# Patient Record
Sex: Male | Born: 1960 | Race: White | Hispanic: No | Marital: Married | Smoking: Former smoker
Health system: Southern US, Community
[De-identification: ages and names within clinical notes are randomized; demographics above are authoritative.]

## PROBLEM LIST (undated history)

## (undated) DIAGNOSIS — H409 Unspecified glaucoma: Secondary | ICD-10-CM

## (undated) DIAGNOSIS — IMO0002 Reserved for concepts with insufficient information to code with codable children: Secondary | ICD-10-CM

## (undated) DIAGNOSIS — E785 Hyperlipidemia, unspecified: Secondary | ICD-10-CM

## (undated) DIAGNOSIS — G510 Bell's palsy: Principal | ICD-10-CM

## (undated) DIAGNOSIS — I10 Essential (primary) hypertension: Secondary | ICD-10-CM

## (undated) HISTORY — DX: Bell's palsy: G51.0

## (undated) HISTORY — DX: Essential (primary) hypertension: I10

## (undated) HISTORY — PX: OTHER SURGICAL HISTORY: SHX169

## (undated) HISTORY — DX: Unspecified glaucoma: H40.9

## (undated) HISTORY — DX: Reserved for concepts with insufficient information to code with codable children: IMO0002

## (undated) HISTORY — DX: Hyperlipidemia, unspecified: E78.5

---

## 1987-01-12 HISTORY — PX: VASECTOMY: SHX75

## 1989-01-11 HISTORY — PX: WISDOM TOOTH EXTRACTION: SHX21

## 2005-12-28 ENCOUNTER — Encounter: Admission: RE | Admit: 2005-12-28 | Discharge: 2005-12-28 | Payer: Self-pay | Admitting: Allergy and Immunology

## 2007-07-24 ENCOUNTER — Ambulatory Visit (HOSPITAL_COMMUNITY): Admission: RE | Admit: 2007-07-24 | Discharge: 2007-07-24 | Payer: Self-pay | Admitting: Family Medicine

## 2007-07-26 ENCOUNTER — Ambulatory Visit (HOSPITAL_COMMUNITY): Admission: RE | Admit: 2007-07-26 | Discharge: 2007-07-26 | Payer: Self-pay | Admitting: Family Medicine

## 2009-10-17 ENCOUNTER — Encounter: Payer: Self-pay | Admitting: Orthopedic Surgery

## 2009-10-17 ENCOUNTER — Ambulatory Visit (HOSPITAL_COMMUNITY): Admission: RE | Admit: 2009-10-17 | Discharge: 2009-10-17 | Payer: Self-pay | Admitting: Family Medicine

## 2009-11-18 ENCOUNTER — Encounter: Payer: Self-pay | Admitting: Orthopedic Surgery

## 2009-11-25 ENCOUNTER — Ambulatory Visit: Payer: Self-pay | Admitting: Orthopedic Surgery

## 2009-11-25 DIAGNOSIS — M4712 Other spondylosis with myelopathy, cervical region: Secondary | ICD-10-CM

## 2009-11-25 DIAGNOSIS — M542 Cervicalgia: Secondary | ICD-10-CM | POA: Insufficient documentation

## 2009-11-25 DIAGNOSIS — M47812 Spondylosis without myelopathy or radiculopathy, cervical region: Secondary | ICD-10-CM

## 2009-11-26 ENCOUNTER — Encounter: Payer: Self-pay | Admitting: Orthopedic Surgery

## 2009-12-10 ENCOUNTER — Encounter (HOSPITAL_COMMUNITY)
Admission: RE | Admit: 2009-12-10 | Discharge: 2010-01-09 | Payer: Self-pay | Source: Home / Self Care | Attending: Orthopedic Surgery | Admitting: Orthopedic Surgery

## 2009-12-15 ENCOUNTER — Encounter: Payer: Self-pay | Admitting: Orthopedic Surgery

## 2009-12-16 ENCOUNTER — Ambulatory Visit (HOSPITAL_COMMUNITY)
Admission: RE | Admit: 2009-12-16 | Discharge: 2009-12-16 | Payer: Self-pay | Source: Home / Self Care | Admitting: Orthopedic Surgery

## 2009-12-17 ENCOUNTER — Encounter: Payer: Self-pay | Admitting: Orthopedic Surgery

## 2009-12-18 ENCOUNTER — Encounter (INDEPENDENT_AMBULATORY_CARE_PROVIDER_SITE_OTHER): Payer: Self-pay | Admitting: *Deleted

## 2009-12-18 ENCOUNTER — Encounter: Payer: Self-pay | Admitting: Orthopedic Surgery

## 2009-12-22 ENCOUNTER — Telehealth: Payer: Self-pay | Admitting: Orthopedic Surgery

## 2009-12-29 ENCOUNTER — Telehealth: Payer: Self-pay | Admitting: Orthopedic Surgery

## 2010-01-15 ENCOUNTER — Encounter: Payer: Self-pay | Admitting: Orthopedic Surgery

## 2010-02-02 ENCOUNTER — Encounter: Payer: Self-pay | Admitting: Family Medicine

## 2010-02-10 NOTE — Letter (Signed)
Summary: *Consult Note  [facility]  [address] [place] [address]  [place], [place] [address]   Phone: [phone]  Fax: [phone]    Re:    Christopher Mcintosh DOB:    09/02/1960   Dear Dr Sherwood Gambler and associates:    Thank you for requesting that we see the above patient for consultation.  A copy of the detailed office note will be sent under separate cover, for your review.  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  I also advised him to take diclofenac for pain.  He seems to only have positional radicular symptoms so I do not think he will need surgery unless his MRI shows something different.  At that point we would refer him to neurosurgery.  In the meantime he can be treated with physical therapy and anti-inflammatories using diclofenac 50 mg twice a day, it iis okay to increase that to 75 b.i.d. if he doesn't respond within 4 weeks.  He is returned to you for further followup unless things change.  We will call him with his MRI results.   New Orders include:  1)  Physical Therapy Referral [PT] 2)  New Patient Level III [99203]   New Medications started today include:  1)  DICLOFENAC SODIUM 50 MG TBEC (DICLOFENAC SODIUM) 1 by mouth two times a day   After today's visit, the patients current medications include: 1)  DICLOFENAC SODIUM 50 MG TBEC (DICLOFENAC SODIUM) 1 by mouth two times a day   Thank you for this consultation.  If you have any further questions regarding the care of this patient, please do not hesitate to contact me @ [phone]  Thank you for this opportunity to look after your patient.  Sincerely,   [name] MD

## 2010-02-10 NOTE — Miscellaneous (Signed)
Summary: mri c spine [facility] 12-16-09 reg 1030am DR H TO CALL WITH RESULTS  Clinical Lists Changes      pt aware of appt, Dr. Rexene Edison will call with results, gave pt [facility] radiology number he may have to reschedule MRI appt.

## 2010-02-10 NOTE — Miscellaneous (Signed)
  phone conversation:  patient agreed to consult

## 2010-02-10 NOTE — Miscellaneous (Signed)
  The MRI was reviewed he has multilevel disc disease and encroachment of several nerves one at C6-C1 at C7 and one at C8.  Recommended he call me back to confirm that I can go ahead and make the neurosurgical referral for further treatment and management

## 2010-02-10 NOTE — Assessment & Plan Note (Signed)
Summary: NECK/SHOULDER PAIN NEEDS XR/TRICARE/MEDCOST/BSF   Vital Signs:  Patient profile:   50 year old male Height:      71 inches Weight:      202 pounds Pulse rate:   80 / minute Resp:     18 per minute  Vitals Entered By: [name] MD (November 25, 2009 4:23 PM)  Visit Type:  new patient Referring Provider:  Robbie Lis Primary Provider:  Robbie Lis  CC:  neck pain.  History of Present Illness: I saw Christopher Mcintosh in the office today for an initial visit.  He is a 50 years old man with the complaint of:  neck pain.  No injury.  Xrays of the neck taken [facility] 10/17/09.  No meds  The patient complains of sharp dull stabbing fairly severe pain in his cervical spine which is intermittent.  His pain started gradually and is associated with numbness tingling and a catching sensation as well as stiffness in the cervical spine.  He complains of a locking type feeling when he turns his head.  Occasionally when he sleeping he will get bilateral upper extremity paresthesias.  He denies any headache.  His symptoms have been present for probably 6 months.    Previous treatment includes heat, prednisone Dosepak 12 days which he did get some relief, Flexeril, Ultram.  He says they really don't help him that much.    Allergies (verified): 1)  ! 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 Gastrointestinal:  Complains of heartburn; denies nausea, vomiting, diarrhea, constipation, and blood in your stools. Neurologic:  Complains of numbness and tingling; denies unsteady gait, dizziness, tremors, and seizure. Musculoskeletal:  Complains of  joint pain, stiffness, and muscle pain; denies swelling, instability, redness, and heat. Psychiatric:  Complains of depression; denies nervousness, anxiety, and hallucinations. Skin:  Complains of changes in the skin and itching; denies poor healing, rash, and redness. HEENT:  Complains of blurred or double vision, eye pain, redness, and watering. Immunology:  Complains of seasonal allergies; denies sinus problems and allergic to bee stings.  The review of systems is negative for Cardiovascular, Respiratory, Genitourinary, Endocrine, and Hemoatologic.  Physical Exam  Msk:  abnormal grooming hygiene, normal body habitus, no developmental abnormalities.   Pulses:  normal radial and ulnar pulses 2+ with normal capillary refill and color of both upper extremities Extremities:  RIGHT and LEFT shoulder elbow and wrists have normal range of motion strength stability and alignment  There is tenderness in the cervical spine primarily on each side no tenderness in the midline no tenderness in the interspaces of the vertebrae.  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  He has cervical spine x-rays from October 7 of this year which show loss of disc space and disc height at C5 and 6 and C6 and 7 consistent with degenerative disc disease of the cervical spine.  The patient has had symptoms for  over 6 months and has failed to improve with anti-inflammatories and muscle relaxers including prednisone.  He does have intermittent upper extremity paresthesias and therefore I think an MRI should be done to evaluate the space available for the cord and rule out cord compression.  I did recommend physical therapy and that he continue his medications using his Flexeril when his neck feels tight and adding diclofenac for an anti-inflammatory.  I've advised him that he doesn't seem to need surgery at this point if he does certainly refer him to neurosurgery if he doesn't we will let him continue his physical therapy and continue followup with his primary care physician using diclofenac for pain relief.  Orders: New Patient Level III (30865)  Problem # 2:  NECK PAIN (ICD-723.1) Assessment: New  His updated medication list for this problem includes:    Diclofenac Sodium 50 Mg Tbec (Diclofenac sodium) .Marland Kitchen... 1 by mouth two times a day  Orders: Physical Therapy Referral (PT) New Patient Level III (78469)  Medications Added to Medication List This Visit: 1)  Diclofenac Sodium 50 Mg Tbec (Diclofenac sodium) .Marland Kitchen.. 1 by mouth two times a day  Patient Instructions: 1)  Rec: MRI  2)  Physical Therapy visit  3)  If your MRI shows any need for surgery we will set up a referral for Neurosurgery appointment 4)  Otherwise we will have you follow up with your primary care doctor for medications and refills  5)  I will call you when I get the report on the MRI  Prescriptions: DICLOFENAC SODIUM 50 MG TBEC (DICLOFENAC SODIUM) 1 by mouth two times a day  #60 x 1   Entered and Authorized by:   [name] MD   Signed by:   [name] MD on 11/25/2009   Method used:   Print then Give to Patient   RxID:   [id number]    Orders Added: 1)  Physical Therapy Referral [PT] 2)  New Patient Level III [72536]

## 2010-02-10 NOTE — Miscellaneous (Signed)
Summary: [facility] referral order  Clinical Lists Changes  Orders: Added new Referral order of Neurosurgeon Referral (Neurosurgeon) - Signed 

## 2010-02-10 NOTE — Letter (Signed)
Summary: History form  History form   Imported By: Jacklynn Ganong 11/26/2009 14:11:32  _____________________________________________________________________  External Attachment:    Type:   Image     Comment:   External Document

## 2010-02-12 NOTE — Miscellaneous (Signed)
Summary: PT clinical evaluation  PT clinical evaluation   Imported By: Jacklynn Ganong 01/15/2010 15:55:07  _____________________________________________________________________  External Attachment:    Type:   Image     Comment:   External Document

## 2010-02-12 NOTE — Progress Notes (Signed)
Summary: New referral Faxed,Dr.Roy,[place],accepting [employer] ins  Phone Note Outgoing Call   Call placed to: Specialist Summary of Call: Received fax from [facility] indicating their office not accepting new [employer] patients.   Patient relayed to our office that he fol'd up and that they are accepting.  I called and left msg at Referral dept to return our call regarding status. Initial call taken by: Cammie Sickle,  December 29, 2009 4:24 PM  Follow-up for Phone Call        Follow up call to patient and to [facility].  Lft voice mail msg for patient at home # [phone]. Lft voice mail msg for ref coordinator again at [phone]. Follow-up by: Cammie Sickle,  January 06, 2010 10:33 AM  Additional Follow-up for Phone Call Additional follow up Details #1::        Rec'd call back from [place] at [facility]. She states it is correct that their office is not taking new [employer]-insured patients at this time; therefore, they cannot schedule there.  Patient will need a new referral.  I had left a message for patient today also per above.    Additional Follow-up by: Cammie Sickle,  January 06, 2010 11:30 AM    Additional Follow-up for Phone Call Additional follow up Details #2::    Patient returned call. Explained and advised about [facility] referral not accepting new [employer] referrals.  He will pursue with [employer] network.  I also tried contacting [facility] and their office stated that patient's PCP would need to also refer.  Referral On Hold until patient returns call with additional in-network information.  Follow-up by: Cammie Sickle,  January 06, 2010 5:54 PM  Additional Follow-up for Phone Call Additional follow up Details #3:: Details for Additional Follow-up Action Taken: Patient has checked website and has also called back to [facility], and they again advised him that they're not accepting new [employer] patients. Per website, neurosurgery offices listed include several from [place].  I  have also contacted Dr. Temple Pacini new practice in [facility] [place], [place] [phone] / fax [phone], and waiting on call back re: accepting of Tricare standard there.   *Received confirmation that Dr Temple Pacini office accepts Tricare, as per [facility] accepting it. Referral faxed to this office; patient aware. Additional Follow-up by: Cammie Sickle,  January 28, 2010 12:42 PM

## 2010-02-12 NOTE — Progress Notes (Signed)
Summary: Referral to [facility], neurosurgeon  Phone Note Outgoing Call   Call placed by: Waldon Reining,  December 22, 2009 9:34 AM Call placed to: Specialist Action Taken: Information Sent Summary of Call: I faxed a referral for this patient to [facility] for his C-spine.

## 2010-02-25 ENCOUNTER — Encounter: Payer: Self-pay | Admitting: Orthopedic Surgery

## 2010-03-19 NOTE — Miscellaneous (Signed)
Summary: Phys Therapy Dischg No Show for folup  Phys Therapy Dischg No Show for folup   Imported By: Cammie Sickle 03/10/2010 19:18:50  _____________________________________________________________________  External Attachment:    Type:   Image     Comment:   External Document

## 2012-11-27 ENCOUNTER — Encounter: Payer: Self-pay | Admitting: Internal Medicine

## 2012-12-27 ENCOUNTER — Ambulatory Visit (AMBULATORY_SURGERY_CENTER): Payer: Self-pay | Admitting: *Deleted

## 2012-12-27 VITALS — Ht 71.0 in | Wt 213.4 lb

## 2012-12-27 DIAGNOSIS — Z1211 Encounter for screening for malignant neoplasm of colon: Secondary | ICD-10-CM

## 2012-12-27 MED ORDER — MOVIPREP 100 G PO SOLR: ORAL | Status: DC

## 2012-12-27 NOTE — Progress Notes (Signed)
No allergies to eggs or soy. No problems with anesthesia.  

## 2013-01-10 ENCOUNTER — Ambulatory Visit (AMBULATORY_SURGERY_CENTER): Admitting: Internal Medicine

## 2013-01-10 ENCOUNTER — Encounter: Payer: Self-pay | Admitting: Internal Medicine

## 2013-01-10 VITALS — BP 118/78 | HR 70 | Temp 97.7°F | Resp 29 | Ht 71.0 in | Wt 213.0 lb

## 2013-01-10 DIAGNOSIS — D126 Benign neoplasm of colon, unspecified: Secondary | ICD-10-CM

## 2013-01-10 DIAGNOSIS — Z1211 Encounter for screening for malignant neoplasm of colon: Secondary | ICD-10-CM

## 2013-01-10 MED ORDER — SODIUM CHLORIDE 0.9 % IV SOLN: 500.0000 mL | INTRAVENOUS | Status: DC

## 2013-01-10 NOTE — Op Note (Signed)
[facility] [address] [place], [address]   COLONOSCOPY PROCEDURE REPORT  PATIENT: Christopher, Mcintosh  MR#: [id number] BIRTHDATE: 1960-05-20 , 52  yrs. old GENDER: Male ENDOSCOPIST: Roxy Cedar, MD REFERRED XB:JYNWG Link Snuffer, M.D. PROCEDURE DATE:  01/10/2013 PROCEDURE:   Colonoscopy with snare polypectomy x 1 First Screening Colonoscopy - Avg.  risk and is 50 yrs.  old or older Yes.  Prior Negative Screening - Now for repeat screening. N/A  History of Adenoma - Now for follow-up colonoscopy & has been > or = to 3 yrs.  N/A  Polyps Removed Today? Yes. ASA CLASS:   Class II INDICATIONS:average risk screening. MEDICATIONS: MAC sedation, administered by CRNA and propofol (Diprivan) 300mg  IV  DESCRIPTION OF PROCEDURE:   After the risks benefits and alternatives of the procedure were thoroughly explained, informed consent was obtained.  A digital rectal exam revealed no abnormalities of the rectum.   The LB [id number]  endoscope was introduced through the anus and advanced to the cecum, which was identified by both the appendix and ileocecal valve. No adverse events experienced.   The quality of the prep was good, using MoviPrep  The instrument was then slowly withdrawn as the colon was fully examined.   COLON FINDINGS: A diminutive polyp was found at the cecum.  A polypectomy was performed with a cold snare.  The resection was complete and the polyp tissue was completely retrieved.   Mild diverticulosis was noted in the sigmoid colon.   The colon mucosa was otherwise normal.  Retroflexed views revealed internal hemorrhoids. The time to cecum=3 minutes 46 seconds.  Withdrawal time=11 minutes 37 seconds.  The scope was withdrawn and the procedure completed. COMPLICATIONS: There were no complications.  ENDOSCOPIC IMPRESSION: 1.   Diminutive polyp was found at the cecum; polypectomy was performed with a cold snare 2.   Mild diverticulosis was noted  in the sigmoid colon 3.   The colon mucosa was otherwise normal  RECOMMENDATIONS: 1. Repeat colonoscopy in 5 years if polyp adenomatous; otherwise 10 years   eSigned:  Roxy Cedar, MD 01/10/2013 9:37 AM   cc: The Patient    ; Alysia Penna, MD

## 2013-01-10 NOTE — Progress Notes (Signed)
Called to room to assist during endoscopic procedure.  Patient ID and intended procedure confirmed with present staff. Received instructions for my participation in the procedure from the performing physician.  

## 2013-01-10 NOTE — Patient Instructions (Signed)
YOU HAD AN ENDOSCOPIC PROCEDURE TODAY AT THE [facility]: Refer to the procedure report that was given to you for any specific questions about what was found during the examination.  If the procedure report does not answer your questions, please call your gastroenterologist to clarify.  If you requested that your care partner not be given the details of your procedure findings, then the procedure report has been included in a sealed envelope for you to review at your convenience later.  YOU SHOULD EXPECT: Some feelings of bloating in the abdomen. Passage of more gas than usual.  Walking can help get rid of the air that was put into your GI tract during the procedure and reduce the bloating. If you had a lower endoscopy (such as a colonoscopy or flexible sigmoidoscopy) you may notice spotting of blood in your stool or on the toilet paper. If you underwent a bowel prep for your procedure, then you may not have a normal bowel movement for a few days.  DIET: Your first meal following the procedure should be a light meal and then it is ok to progress to your normal diet.  A half-sandwich or bowl of soup is an example of a good first meal.  Heavy or fried foods are harder to digest and may make you feel nauseous or bloated.  Likewise meals heavy in dairy and vegetables can cause extra gas to form and this can also increase the bloating.  Drink plenty of fluids but you should avoid alcoholic beverages for 24 hours.  ACTIVITY: Your care partner should take you home directly after the procedure.  You should plan to take it easy, moving slowly for the rest of the day.  You can resume normal activity the day after the procedure however you should NOT DRIVE or use heavy machinery for 24 hours (because of the sedation medicines used during the test).    SYMPTOMS TO REPORT IMMEDIATELY: A gastroenterologist can be reached at any hour.  During normal business hours, 8:30 AM to 5:00 PM Monday through Friday,  call [phone].  After hours and on weekends, please call the GI answering service at [phone]  Emergency number who will take a message and have the physician on call contact you.   Following lower endoscopy (colonoscopy or flexible sigmoidoscopy):  Excessive amounts of blood in the stool  Significant tenderness or worsening of abdominal pains  Swelling of the abdomen that is new, acute  Fever of 100F or higher  FOLLOW UP: If any biopsies were taken you will be contacted by phone or by letter within the next 1-3 weeks.  Call your gastroenterologist if you have not heard about the biopsies in 3 weeks.  Our staff will call the home number listed on your records the next business day following your procedure to check on you and address any questions or concerns that you may have at that time regarding the information given to you following your procedure. This is a courtesy call and so if there is no answer at the home number and we have not heard from you through the emergency physician on call, we will assume that you have returned to your regular daily activities without incident.  SIGNATURES/CONFIDENTIALITY: You and/or your care partner have signed paperwork which will be entered into your electronic medical record.  These signatures attest to the fact that that the information above on your After Visit Summary has been reviewed and is understood.  Full responsibility of the   confidentiality of this discharge information lies with you and/or your care-partner.  Handout on polyps diverticulosis high fiber diet

## 2013-01-10 NOTE — Progress Notes (Signed)
Patient did not experience any of the following events: a burn prior to discharge; a fall within the facility; wrong site/side/patient/procedure/implant event; or a hospital transfer or hospital admission upon discharge from the facility. (G8907) Patient did not have preoperative order for IV antibiotic SSI prophylaxis. (G8918)  

## 2013-01-12 ENCOUNTER — Telehealth: Payer: Self-pay

## 2013-01-12 NOTE — Telephone Encounter (Signed)
  Follow up Call-  Call back number 01/10/2013  Post procedure Call Back phone  # [phone]  Permission to leave phone message Yes     Patient questions:  Do you have a fever, pain , or abdominal swelling? no Pain Score  0 *  Have you tolerated food without any problems? yes  Have you been able to return to your normal activities? yes  Do you have any questions about your discharge instructions: Diet   no Medications  no Follow up visit  no  Do you have questions or concerns about your Care? no  Actions: * If pain score is 4 or above: No action needed, pain <4.

## 2013-01-15 ENCOUNTER — Encounter: Payer: Self-pay | Admitting: Internal Medicine

## 2013-07-21 ENCOUNTER — Emergency Department (HOSPITAL_COMMUNITY)
Admission: EM | Admit: 2013-07-21 | Discharge: 2013-07-21 | Disposition: A | Discharge status: Home / Self Care | Payer: Self-pay | Attending: Emergency Medicine | Admitting: Emergency Medicine

## 2013-07-21 ENCOUNTER — Emergency Department (HOSPITAL_COMMUNITY): Payer: Self-pay

## 2013-07-21 ENCOUNTER — Emergency Department (HOSPITAL_COMMUNITY)
Admission: EM | Admit: 2013-07-21 | Discharge: 2013-07-21 | Discharge status: Absent without leave | Source: Home / Self Care | Attending: Emergency Medicine | Admitting: Emergency Medicine

## 2013-07-21 ENCOUNTER — Encounter (HOSPITAL_COMMUNITY): Payer: Self-pay | Admitting: Emergency Medicine

## 2013-07-21 ENCOUNTER — Encounter

## 2013-07-21 ENCOUNTER — Other Ambulatory Visit (HOSPITAL_COMMUNITY)

## 2013-07-21 DIAGNOSIS — G510 Bell's palsy: Secondary | ICD-10-CM | POA: Insufficient documentation

## 2013-07-21 LAB — DIFFERENTIAL
BASOS ABS: 0 10*3/uL (ref 0.0–0.1)
BASOS PCT: 0 % (ref 0–1)
EOS PCT: 1 % (ref 0–5)
Eosinophils Absolute: 0.1 10*3/uL (ref 0.0–0.7)
LYMPHS PCT: 43 % (ref 12–46)
Lymphs Abs: 2.3 10*3/uL (ref 0.7–4.0)
Monocytes Absolute: 0.4 10*3/uL (ref 0.1–1.0)
Monocytes Relative: 7 % (ref 3–12)
NEUTROS ABS: 2.6 10*3/uL (ref 1.7–7.7)
Neutrophils Relative %: 49 % (ref 43–77)

## 2013-07-21 LAB — CBC
HCT: 44.6 % (ref 39.0–52.0)
Hemoglobin: 15.4 g/dL (ref 13.0–17.0)
MCH: 30.9 pg (ref 26.0–34.0)
MCHC: 34.5 g/dL (ref 30.0–36.0)
MCV: 89.6 fL (ref 78.0–100.0)
PLATELETS: 188 10*3/uL (ref 150–400)
RBC: 4.98 MIL/uL (ref 4.22–5.81)
RDW: 13.2 % (ref 11.5–15.5)
WBC: 5.3 10*3/uL (ref 4.0–10.5)

## 2013-07-21 LAB — COMPREHENSIVE METABOLIC PANEL
ALT: 35 U/L (ref 0–53)
AST: 23 U/L (ref 0–37)
Albumin: 4.2 g/dL (ref 3.5–5.2)
Alkaline Phosphatase: 96 U/L (ref 39–117)
Anion gap: 13 (ref 5–15)
BUN: 9 mg/dL (ref 6–23)
CO2: 27 meq/L (ref 19–32)
CREATININE: 1.05 mg/dL (ref 0.50–1.35)
Calcium: 9.8 mg/dL (ref 8.4–10.5)
Chloride: 102 mEq/L (ref 96–112)
GFR calc Af Amer: >90 mL/min (ref >90)
GFR, EST NON AFRICAN AMERICAN: 80 mL/min — AB (ref >90)
Glucose, Bld: 124 mg/dL — ABNORMAL HIGH (ref 70–99)
Potassium: 4.4 mEq/L (ref 3.7–5.3)
SODIUM: 142 meq/L (ref 137–147)
Total Bilirubin: 0.4 mg/dL (ref 0.3–1.2)
Total Protein: 7.2 g/dL (ref 6.0–8.3)

## 2013-07-21 MED ORDER — ACYCLOVIR 400 MG PO TABS: 800.0000 mg | ORAL_TABLET | Freq: Two times a day (BID) | ORAL | Status: DC

## 2013-07-21 MED ORDER — PREDNISONE 10 MG PO TABS: 60.0000 mg | ORAL_TABLET | Freq: Every day | ORAL | Status: DC

## 2013-07-21 NOTE — Discharge Instructions (Signed)
Bell's Palsy °Bell's palsy is a condition in which the muscles on one side of the face cannot move (paralysis). This is because the nerves in the face are paralyzed. It is most often thought to be caused by a virus. The virus causes swelling of the nerve that controls movement on one side of the face. The nerve travels through a tight space surrounded by bone. When the nerve swells, it can be compressed by the bone. This results in damage to the protective covering around the nerve. This damage interferes with how the nerve communicates with the muscles of the face. As a result, it can cause weakness or paralysis of the facial muscles.  °Injury (trauma), tumor, and surgery may cause Bell's palsy, but most of the time the cause is unknown. It is a relatively common condition. It starts suddenly (abrupt onset) with the paralysis usually ending within 2 days. Bell's palsy is not dangerous. But because the eye does not close properly, you may need care to keep the eye from getting dry. This can include splinting (to keep the eye shut) or moistening with artificial tears. Bell's palsy very seldom occurs on both sides of the face at the same time. °SYMPTOMS  °· Eyebrow sagging. °· Drooping of the eyelid and corner of the mouth. °· Inability to close one eye. °· Loss of taste on the front of the tongue. °· Sensitivity to loud noises. °TREATMENT  °The treatment is usually non-surgical. If the patient is seen within the first 24 to 48 hours, a short course of steroids may be prescribed, in an attempt to shorten the length of the condition. Antiviral medicines may also be used with the steroids, but it is unclear if they are helpful.  °You will need to protect your eye, if you cannot close it. The cornea (clear covering over your eye) will become dry and can be damaged. Artificial tears can be used to keep your eye moist. Glasses or an eye patch should be worn to protect your eye. °PROGNOSIS  °Recovery is variable, ranging  from days to months. Although the problem usually goes away completely (about 80% of cases resolve), predicting the outcome is impossible. Most people improve within 3 weeks of when the symptoms began. Improvement may continue for 3 to 6 months. A small number of people have moderate to severe weakness that is permanent.  °HOME CARE INSTRUCTIONS  °· If your caregiver prescribed medication to reduce swelling in the nerve, use as directed. Do not stop taking the medication unless directed by your caregiver. °· Use moisturizing eye drops as needed to prevent drying of your eye, as directed by your caregiver. °· Protect your eye, as directed by your caregiver. °· Use facial massage and exercises, as directed by your caregiver. °· Perform your normal activities, and get your normal rest. °SEEK IMMEDIATE MEDICAL CARE IF:  °· There is pain, redness or irritation in the eye. °· You or your child has an oral temperature above 102° F (38.9° C), not controlled by medicine. °MAKE SURE YOU:  °· Understand these instructions. °· Will watch your condition. °· Will get help right away if you are not doing well or get worse. °Document Released: 12/28/2004 Document Revised: 03/22/2011 Document Reviewed: 01/06/2009 °ExitCare® Patient Information ©2015 [employer]. This information is not intended to replace advice given to you by your health care provider. Make sure you discuss any questions you have with your health care provider. ° °

## 2013-07-21 NOTE — ED Notes (Signed)
Pt sent here from [facility]. Reports onset of headache since tues or wed. Having right eye droop, unable to close his eye and drainage from it. Reports numbness to right mouth since yesterday. Sent here for possible bells palsy, r/o stroke. No acute distress noted at triage, bp 182/107.

## 2013-07-21 NOTE — ED Notes (Signed)
Pt sent here from [facility] for headache that started approx tues or wed. Having right eye droop and unable to close right eye, having drainage from it.  Reports numbness to right side of mouth since yesterday. Sent here to r/o bells palsy vs cva.

## 2013-07-21 NOTE — ED Provider Notes (Addendum)
CSN: [id number]     Arrival date & time 07/21/13  1520 History   First MD Initiated Contact with Patient 07/21/13 1528     Chief Complaint  Patient presents with  . Headache  . Facial Droop     (Consider location/radiation/quality/duration/timing/severity/associated sxs/prior Treatment) Patient is a 53 y.o. male presenting with headaches. The history is provided by the patient.  Headache Pain location:  R parietal Quality:  Dull Radiates to:  Does not radiate Onset quality:  Gradual Duration:  3 days Timing:  Intermittent Progression:  Worsening Chronicity:  New Similar to prior headaches: no   Context: not exposure to bright light, not caffeine, not eating and not loud noise   Relieved by:  Nothing Worsened by:  Nothing tried Associated symptoms: no abdominal pain, no cough, no fever and no vomiting     No past medical history on file. No past surgical history on file. No family history on file. History  Substance Use Topics  . Smoking status: Not on file  . Smokeless tobacco: Not on file  . Alcohol Use: Not on file    Review of Systems  Constitutional: Negative for fever.  Respiratory: Negative for cough and shortness of breath.   Cardiovascular: Negative for leg swelling.  Gastrointestinal: Negative for vomiting and abdominal pain.  Neurological: Positive for headaches.  All other systems reviewed and are negative.     Allergies  Review of patient's allergies indicates not on file.  Home Medications   Prior to Admission medications   Medication Sig Start Date End Date Taking? Authorizing Provider  acyclovir (ZOVIRAX) 400 MG tablet Take 2 tablets (800 mg total) by mouth 2 (two) times daily. 07/21/13   Dagmar Hait, MD  predniSONE (DELTASONE) 10 MG tablet Take 6 tablets (60 mg total) by mouth daily. Please take 6 tablets once daily for 5 days, then 5 tablets one day, then 4 tablets one day, then 3 tablets one day, then 2 tablets one day, then one  tablet on the 10th day 07/21/13   Dagmar Hait, MD   BP 162/84  Pulse 59  Temp(Src) 97.3 F (36.3 C) (Oral)  Resp 12  SpO2 97% Physical Exam  Nursing note and vitals reviewed. Constitutional: He is oriented to person, place, and time. He appears well-developed and well-nourished. No distress.  HENT:  Head: Normocephalic and atraumatic.  Mouth/Throat: Oropharynx is clear and moist. No oropharyngeal exudate.  Eyes: EOM are normal. Pupils are equal, round, and reactive to light.  Neck: Normal range of motion. Neck supple.  Cardiovascular: Normal rate and regular rhythm.  Exam reveals no friction rub.   No murmur heard. Pulmonary/Chest: Effort normal and breath sounds normal. No respiratory distress. He has no wheezes. He has no rales.  Abdominal: He exhibits no distension. There is no tenderness. There is no rebound.  Musculoskeletal: Normal range of motion. He exhibits no edema.  Neurological: He is alert and oriented to person, place, and time. A cranial nerve deficit (R sided facial droop with forehead involvement) is present. No sensory deficit. GCS eye subscore is 4. GCS verbal subscore is 5. GCS motor subscore is 6.  Skin: No rash noted. He is not diaphoretic.    ED Course  Procedures (including critical care time) Labs Review Labs Reviewed - No data to display  Imaging Review No results found.   EKG Interpretation None      MDM   Final diagnoses:  Bell's palsy    53 year old male here with  facial droop. Began last night. Can constant. [facility] concerns of Bell's palsy and headache. The doctor there is requesting a CT or MRI. Upon arrival, patient has Bell's palsy. Forehead was not spared and right side of face is not working. I spoke with the neurologist who reported CT would be appropriate for imaging. No facial numbness. CT is negative. Given steroids, acyclovir, instructed to tape his eye shut while sleeping. Given neurology  followup. Head CT report under patient named Lorie Apleyobert L Lucken with same birthdate.  Patient also has HTN. Instructed to f/u with PCP for HTN management.   Dagmar HaitWilliam Elis Sauber, MD 07/21/13 1545  Dagmar HaitWilliam Bethany Cumming, MD 07/21/13 [phone]

## 2013-07-25 LAB — I-STAT TROPONIN, ED
TROPONIN I, POC: 0 ng/mL (ref 0.00–0.08)
TROPONIN I, POC: 0 ng/mL (ref 0.00–0.08)

## 2013-08-10 ENCOUNTER — Ambulatory Visit (INDEPENDENT_AMBULATORY_CARE_PROVIDER_SITE_OTHER): Payer: Self-pay | Admitting: Neurology

## 2013-08-10 ENCOUNTER — Encounter: Payer: Self-pay | Admitting: Neurology

## 2013-08-10 VITALS — BP 157/87 | HR 67 | Ht 70.5 in | Wt 230.0 lb

## 2013-08-10 DIAGNOSIS — G510 Bell's palsy: Secondary | ICD-10-CM | POA: Insufficient documentation

## 2013-08-10 HISTORY — DX: Bell's palsy: G51.0

## 2013-08-10 NOTE — Progress Notes (Signed)
Reason for visit: Bell's palsy  Taylor Roberts is a 53 y.o. male  History of present illness:  Mr. Taylor Roberts is a 53 year old white male with a history of hypertension, untreated. The patient went to the emergency room on 07/21/2013 with onset the day prior of some right facial weakness. He began having some discomfort around the right ear, and in the frontal and temporal regions, radiating back into the occipital area head. The patient has noted some tearing around the right eye, and some hyperacusis involving the right ear. He has not noted any alteration in taste. He has been using artificial tears to help keep the eye wet. He has not noted any speech or swallowing problems, and he has not had any weakness or numbness of the extremities. He denies any coordination problems with the arms or legs, or difficulty with balance with walking, and he denies any problems controlling the bowels or the bladder. He comes to this office for an evaluation. A CT scan of the brain was done through the emergency room, and this was unremarkable. His ability to close his right eye has already improved. He still has some residual mild headache on the right side.  Past Medical History  Diagnosis Date  . Hypertension   . Right-sided Bell's palsy 08/10/2013    Past Surgical History  Procedure Laterality Date  . None      Family History  Problem Relation Age of Onset  . Hypertension Mother   . Cancer - Prostate Father   . Hypertension Father   . Hypertension Sister   . Hypertension Brother     Social history:  reports that he has been smoking Cigarettes.  He has a 35 pack-year smoking history. He has never used smokeless tobacco. He reports that he drinks alcohol. He reports that he does not use illicit drugs.  Medications:  No current outpatient prescriptions on file prior to visit.   No current facility-administered medications on file prior to visit.     No Known Allergies  ROS:  Out of a  complete 14 system review of symptoms, the patient complains only of the following symptoms, and all other reviewed systems are negative.  Eye pain Headache, slurred speech  Blood pressure 157/87, pulse 67, height 5' 10.5" (1.791 m), weight 230 lb (104.327 kg).  Physical Exam  General: The patient is alert and cooperative at the time of the examination.  Eyes: Pupils are equal, round, and reactive to light. Discs are flat bilaterally.  Ears: Tympanic membranes are clear bilaterally.  Neck: The neck is supple, no carotid bruits are noted.  Respiratory: The respiratory examination is clear.  Cardiovascular: The cardiovascular examination reveals a regular rate and rhythm, no obvious murmurs or rubs are noted.  Skin: Extremities are without significant edema.  Neurologic Exam  Mental status: The patient is alert and oriented x 3 at the time of the examination. The patient has apparent normal recent and remote memory, with an apparently normal attention span and concentration ability.  Cranial nerves: Facial symmetry is not present. There is good sensation of the face to pinprick and soft touch bilaterally. The strength of the facial muscles and the muscles to head turning and shoulder shrug are normal bilaterally, with the exception that there is some moderate peripheral facial weakness on the right. Speech is well enunciated, no aphasia or dysarthria is noted. Extraocular movements are full. Visual fields are full. The tongue is midline, and the patient has symmetric elevation of  the soft palate. No obvious hearing deficits are noted.  Motor: The motor testing reveals 5 over 5 strength of all 4 extremities. Good symmetric motor tone is noted throughout.  Sensory: Sensory testing is intact to pinprick, soft touch, vibration sensation, and position sense on all 4 extremities. No evidence of extinction is noted.  Coordination: Cerebellar testing reveals good finger-nose-finger and  heel-to-shin bilaterally.  Gait and station: Gait is normal. Tandem gait is normal. Romberg is negative. No drift is seen.  Reflexes: Deep tendon reflexes are symmetric and normal bilaterally. Toes are downgoing bilaterally.   CT head 07/21/13:  IMPRESSION:  Negative head CT.   Assessment/Plan:  One. Right Bell's palsy  The patient is already getting benefit with the weakness, he should do quite well over time. The patient will followup through this office if needed. He will take Motrin on a scheduled basis taking 600 mg 3 times daily over the next week to help benefit the headache. He is to contact our office if he has any concerns.  Marlan Palau MD 08/11/2013 10:17 AM  [facility] [address] [place], [place] [address]  Phone [phone] Fax [phone]

## 2013-08-10 NOTE — Patient Instructions (Signed)
Bell's Palsy °Bell's palsy is a condition in which the muscles on one side of the face cannot move (paralysis). This is because the nerves in the face are paralyzed. It is most often thought to be caused by a virus. The virus causes swelling of the nerve that controls movement on one side of the face. The nerve travels through a tight space surrounded by bone. When the nerve swells, it can be compressed by the bone. This results in damage to the protective covering around the nerve. This damage interferes with how the nerve communicates with the muscles of the face. As a result, it can cause weakness or paralysis of the facial muscles.  °Injury (trauma), tumor, and surgery may cause Bell's palsy, but most of the time the cause is unknown. It is a relatively common condition. It starts suddenly (abrupt onset) with the paralysis usually ending within 2 days. Bell's palsy is not dangerous. But because the eye does not close properly, you may need care to keep the eye from getting dry. This can include splinting (to keep the eye shut) or moistening with artificial tears. Bell's palsy very seldom occurs on both sides of the face at the same time. °SYMPTOMS  °· Eyebrow sagging. °· Drooping of the eyelid and corner of the mouth. °· Inability to close one eye. °· Loss of taste on the front of the tongue. °· Sensitivity to loud noises. °TREATMENT  °The treatment is usually non-surgical. If the patient is seen within the first 24 to 48 hours, a short course of steroids may be prescribed, in an attempt to shorten the length of the condition. Antiviral medicines may also be used with the steroids, but it is unclear if they are helpful.  °You will need to protect your eye, if you cannot close it. The cornea (clear covering over your eye) will become dry and can be damaged. Artificial tears can be used to keep your eye moist. Glasses or an eye patch should be worn to protect your eye. °PROGNOSIS  °Recovery is variable, ranging  from days to months. Although the problem usually goes away completely (about 80% of cases resolve), predicting the outcome is impossible. Most people improve within 3 weeks of when the symptoms began. Improvement may continue for 3 to 6 months. A small number of people have moderate to severe weakness that is permanent.  °HOME CARE INSTRUCTIONS  °· If your caregiver prescribed medication to reduce swelling in the nerve, use as directed. Do not stop taking the medication unless directed by your caregiver. °· Use moisturizing eye drops as needed to prevent drying of your eye, as directed by your caregiver. °· Protect your eye, as directed by your caregiver. °· Use facial massage and exercises, as directed by your caregiver. °· Perform your normal activities, and get your normal rest. °SEEK IMMEDIATE MEDICAL CARE IF:  °· There is pain, redness or irritation in the eye. °· You or your child has an oral temperature above 102° F (38.9° C), not controlled by medicine. °MAKE SURE YOU:  °· Understand these instructions. °· Will watch your condition. °· Will get help right away if you are not doing well or get worse. °Document Released: 12/28/2004 Document Revised: 03/22/2011 Document Reviewed: 04/06/2013 °[employer]® Patient Information ©2015 [employer]. This information is not intended to replace advice given to you by your health care provider. Make sure you discuss any questions you have with your health care provider. ° °

## 2015-03-31 ENCOUNTER — Other Ambulatory Visit (HOSPITAL_BASED_OUTPATIENT_CLINIC_OR_DEPARTMENT_OTHER): Payer: Self-pay | Admitting: Family Medicine

## 2015-03-31 DIAGNOSIS — R0989 Other specified symptoms and signs involving the circulatory and respiratory systems: Secondary | ICD-10-CM

## 2015-04-04 ENCOUNTER — Ambulatory Visit (HOSPITAL_BASED_OUTPATIENT_CLINIC_OR_DEPARTMENT_OTHER)
Admission: RE | Admit: 2015-04-04 | Discharge: 2015-04-04 | Disposition: A | Discharge status: Home / Self Care | Payer: BLUE CROSS/BLUE SHIELD | Source: Ambulatory Visit | Attending: Family Medicine | Admitting: Family Medicine

## 2015-04-04 DIAGNOSIS — I6523 Occlusion and stenosis of bilateral carotid arteries: Secondary | ICD-10-CM | POA: Diagnosis not present

## 2015-04-04 DIAGNOSIS — R0989 Other specified symptoms and signs involving the circulatory and respiratory systems: Secondary | ICD-10-CM | POA: Insufficient documentation

## 2015-04-26 IMAGING — CT CT HEAD W/O CM
2 series · 16 of 30 positions shown, 20 images · non-contrast
Comparison: None.

CLINICAL DATA: Headache. Right eye drooping. Numbness right side of
the mouth.

EXAM:
CT HEAD WITHOUT CONTRAST
TECHNIQUE: Contiguous axial images were obtained from the base of the skull
through the vertex without intravenous contrast.

[Series 201: head w/o, idose (1) · axial · non-contrast · 0.49mm/px · z∈[+100,+230]mm · 13 of 32 slices shown, 17 images]
[im 3/32  brain]
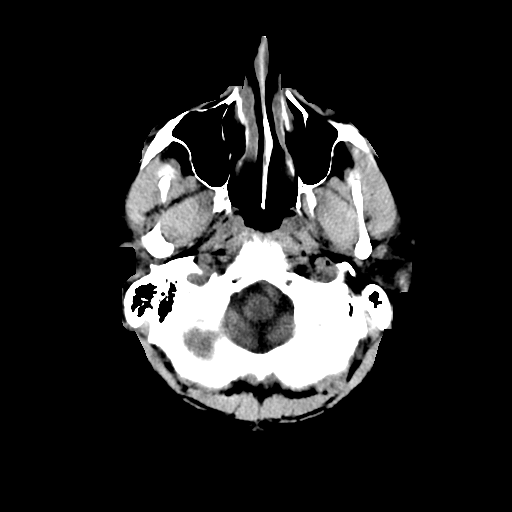
[im 3/32  bone]
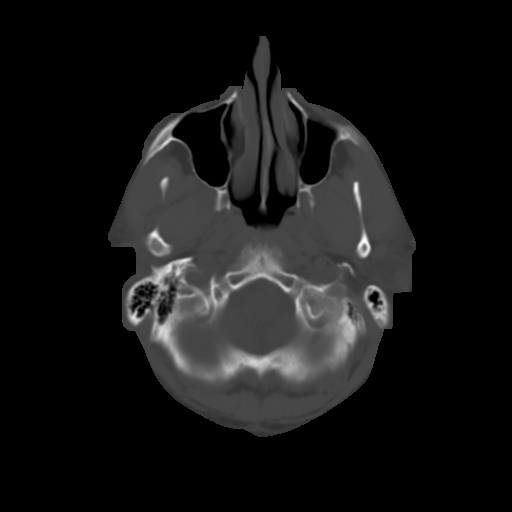
[im 5/32  brain]
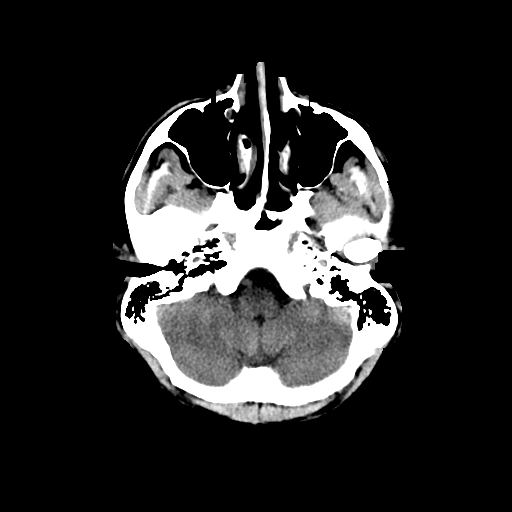
[im 7/32  brain]
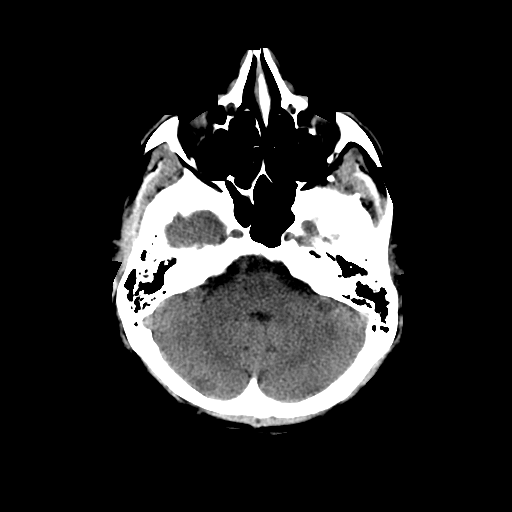
[im 9/32  brain]
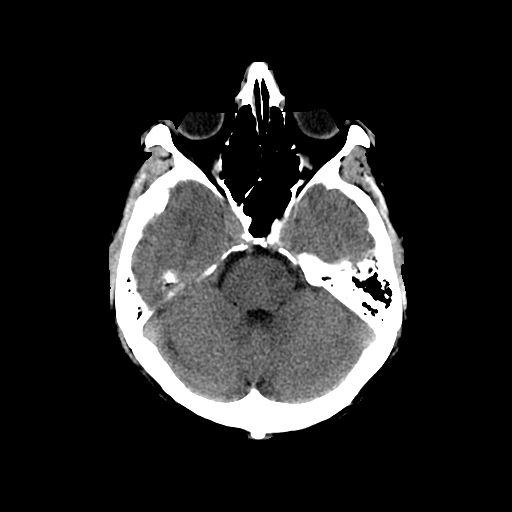
[im 12/32  brain]
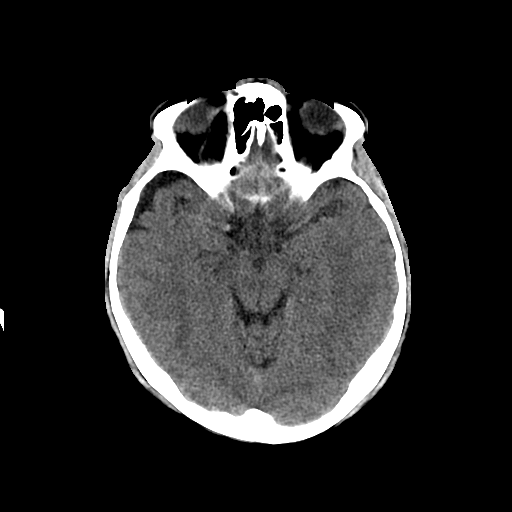
[im 12/32  bone]
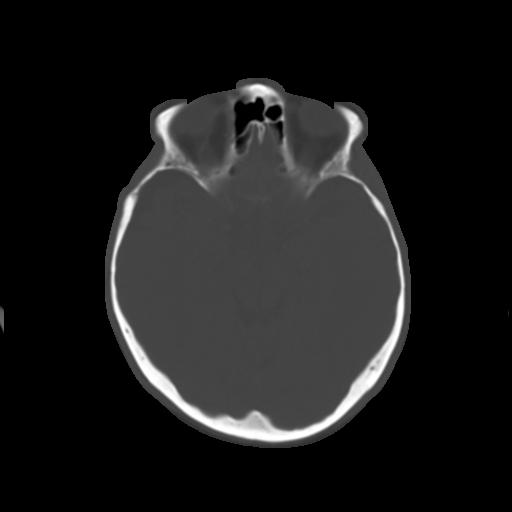
[im 14/32  brain]
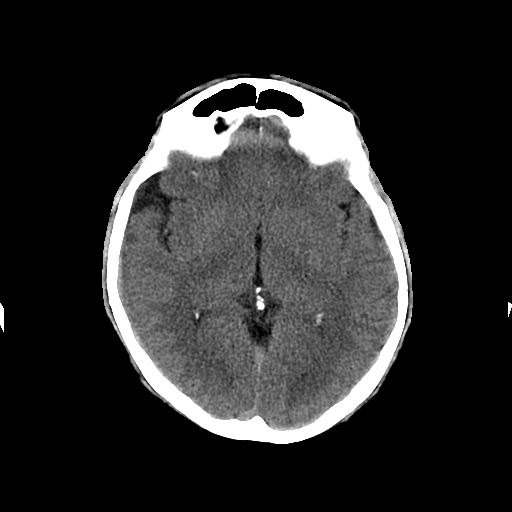
[im 16/32  brain]
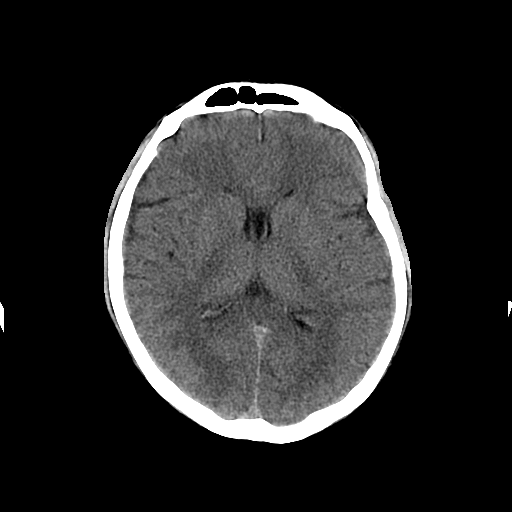
[im 18/32  brain]
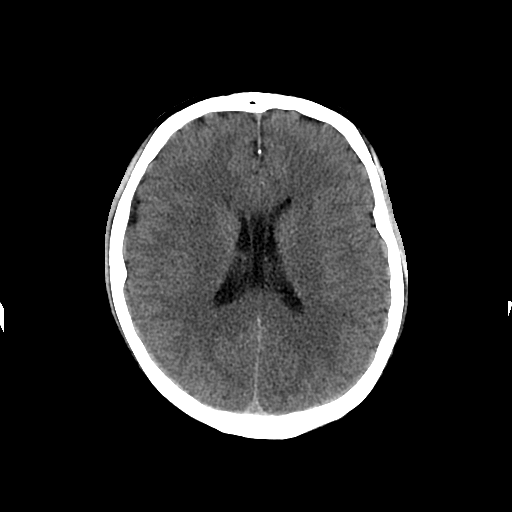
[im 20/32  brain]
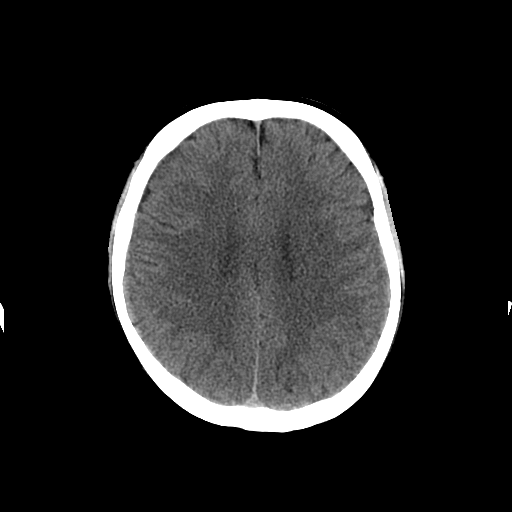
[im 20/32  bone]
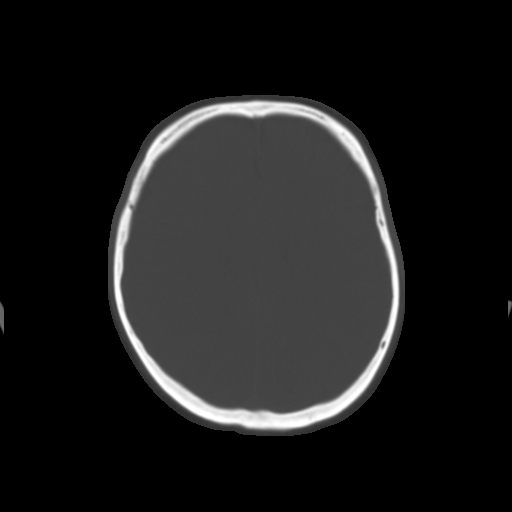
[im 23/32  brain]
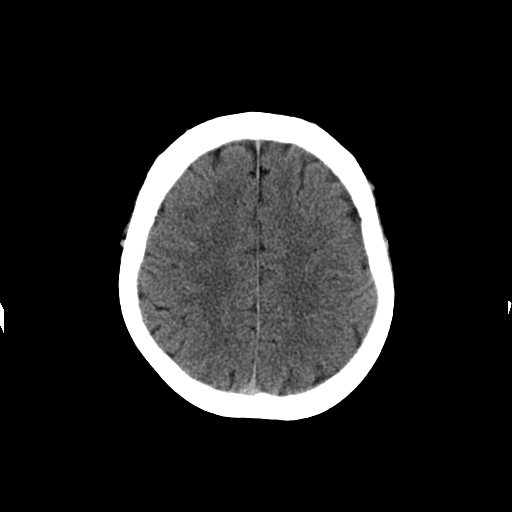
[im 25/32  brain]
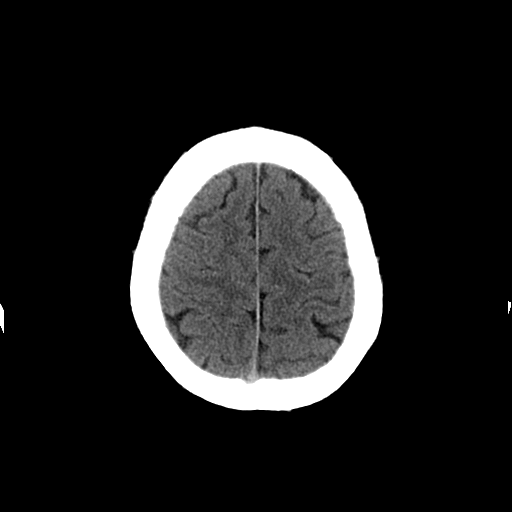
[im 27/32  brain]
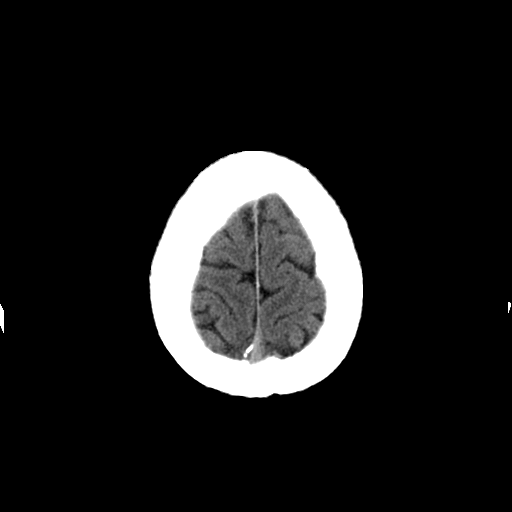
[im 29/32  brain]
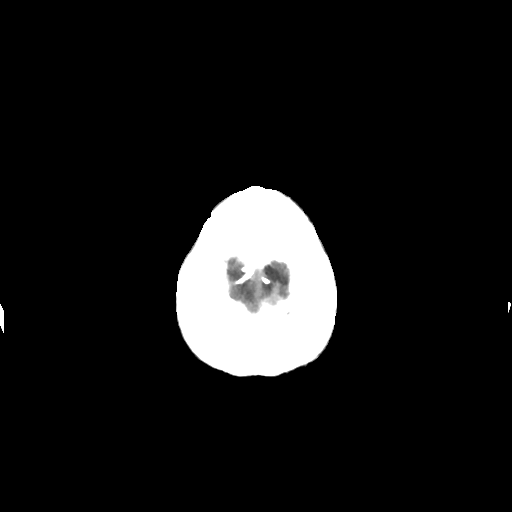
[im 29/32  bone]
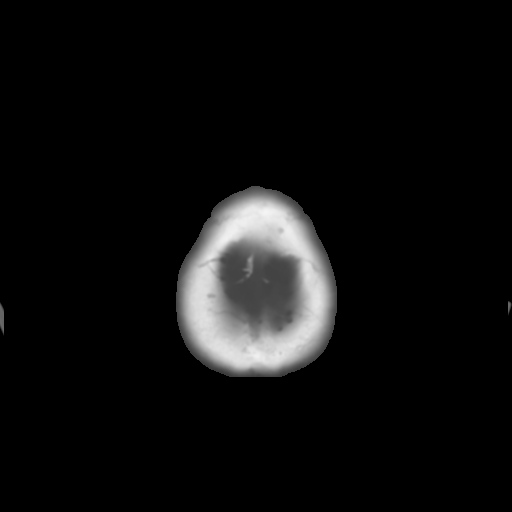

[Series 202: head w/o bone, idose (1) · axial · non-contrast · 0.49mm/px · z∈[+100,+145]mm · 3 of 32 slices shown]
[im 3/32  bone]
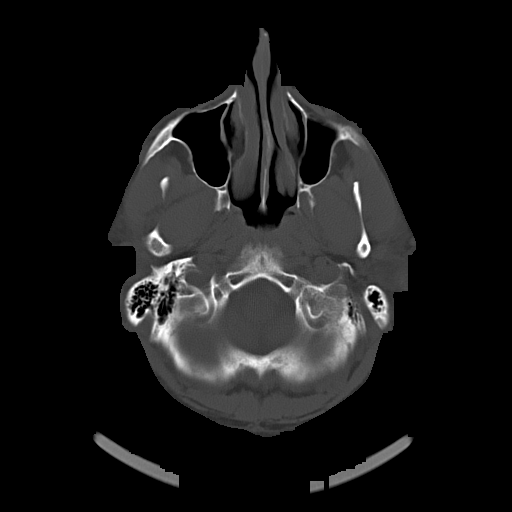
[im 7/32  bone]
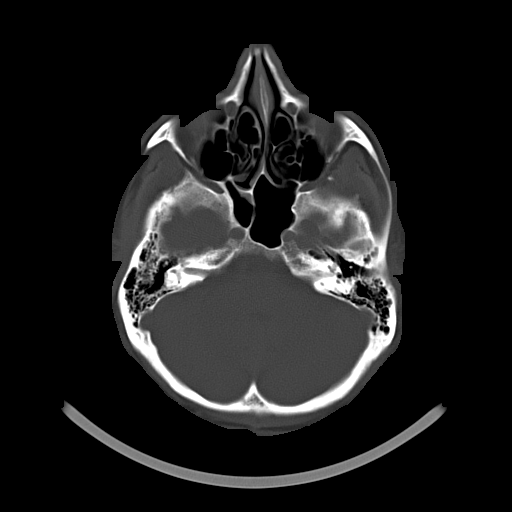
[im 12/32  bone]
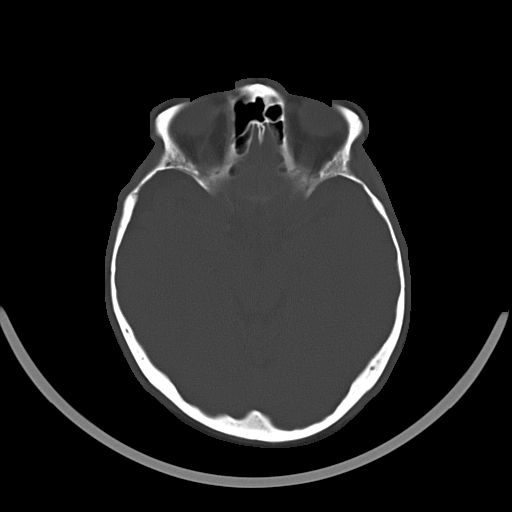

[16 of 30 positions shown; findings below may reference images not displayed]

FINDINGS: There is no evidence of acute intracranial abnormality including
infarction, hemorrhage, mass lesion, mass effect, midline shift or
abnormal extra-axial fluid collection. No hydrocephalus or
pneumocephalus. Cavum septum pellucidum is incidentally noted. The
calvarium is intact. Imaged paranasal sinuses and mastoid air cells
are clear.
IMPRESSION: Negative head CT.

## 2015-04-28 DIAGNOSIS — M545 Low back pain: Secondary | ICD-10-CM | POA: Diagnosis not present

## 2015-05-01 DIAGNOSIS — M545 Low back pain: Secondary | ICD-10-CM | POA: Diagnosis not present

## 2015-05-06 DIAGNOSIS — M545 Low back pain: Secondary | ICD-10-CM | POA: Diagnosis not present

## 2015-05-08 DIAGNOSIS — M545 Low back pain: Secondary | ICD-10-CM | POA: Diagnosis not present

## 2015-05-13 DIAGNOSIS — M545 Low back pain: Secondary | ICD-10-CM | POA: Diagnosis not present

## 2015-05-15 DIAGNOSIS — M545 Low back pain: Secondary | ICD-10-CM | POA: Diagnosis not present

## 2015-05-20 DIAGNOSIS — G8929 Other chronic pain: Secondary | ICD-10-CM | POA: Diagnosis not present

## 2015-05-20 DIAGNOSIS — M5441 Lumbago with sciatica, right side: Secondary | ICD-10-CM | POA: Diagnosis not present

## 2015-05-20 DIAGNOSIS — M542 Cervicalgia: Secondary | ICD-10-CM | POA: Diagnosis not present

## 2015-05-21 DIAGNOSIS — M545 Low back pain: Secondary | ICD-10-CM | POA: Diagnosis not present

## 2015-05-22 DIAGNOSIS — M545 Low back pain: Secondary | ICD-10-CM | POA: Diagnosis not present

## 2015-06-05 DIAGNOSIS — M50821 Other cervical disc disorders at C4-C5 level: Secondary | ICD-10-CM | POA: Diagnosis not present

## 2015-06-05 DIAGNOSIS — M545 Low back pain: Secondary | ICD-10-CM | POA: Diagnosis not present

## 2015-06-05 DIAGNOSIS — M542 Cervicalgia: Secondary | ICD-10-CM | POA: Diagnosis not present

## 2015-06-05 DIAGNOSIS — M5136 Other intervertebral disc degeneration, lumbar region: Secondary | ICD-10-CM | POA: Diagnosis not present

## 2015-06-05 DIAGNOSIS — M50822 Other cervical disc disorders at C5-C6 level: Secondary | ICD-10-CM | POA: Diagnosis not present

## 2015-06-06 DIAGNOSIS — M5412 Radiculopathy, cervical region: Secondary | ICD-10-CM | POA: Diagnosis not present

## 2015-06-06 DIAGNOSIS — M5416 Radiculopathy, lumbar region: Secondary | ICD-10-CM | POA: Diagnosis not present

## 2015-10-24 DIAGNOSIS — Q6622 Congenital metatarsus adductus: Secondary | ICD-10-CM | POA: Diagnosis not present

## 2015-10-24 DIAGNOSIS — M71571 Other bursitis, not elsewhere classified, right ankle and foot: Secondary | ICD-10-CM | POA: Diagnosis not present

## 2015-10-24 DIAGNOSIS — M71572 Other bursitis, not elsewhere classified, left ankle and foot: Secondary | ICD-10-CM | POA: Diagnosis not present

## 2015-11-10 DIAGNOSIS — M25571 Pain in right ankle and joints of right foot: Secondary | ICD-10-CM | POA: Diagnosis not present

## 2015-11-10 DIAGNOSIS — M25572 Pain in left ankle and joints of left foot: Secondary | ICD-10-CM | POA: Diagnosis not present

## 2015-11-10 DIAGNOSIS — M25774 Osteophyte, right foot: Secondary | ICD-10-CM | POA: Diagnosis not present

## 2015-12-11 DIAGNOSIS — M2042 Other hammer toe(s) (acquired), left foot: Secondary | ICD-10-CM | POA: Diagnosis not present

## 2015-12-11 DIAGNOSIS — M21622 Bunionette of left foot: Secondary | ICD-10-CM | POA: Diagnosis not present

## 2015-12-11 DIAGNOSIS — M722 Plantar fascial fibromatosis: Secondary | ICD-10-CM | POA: Diagnosis not present

## 2015-12-11 DIAGNOSIS — M21621 Bunionette of right foot: Secondary | ICD-10-CM | POA: Diagnosis not present

## 2015-12-11 DIAGNOSIS — Z01812 Encounter for preprocedural laboratory examination: Secondary | ICD-10-CM | POA: Diagnosis not present

## 2015-12-11 DIAGNOSIS — M25572 Pain in left ankle and joints of left foot: Secondary | ICD-10-CM | POA: Diagnosis not present

## 2015-12-23 DIAGNOSIS — M2042 Other hammer toe(s) (acquired), left foot: Secondary | ICD-10-CM | POA: Diagnosis not present

## 2015-12-23 DIAGNOSIS — M779 Enthesopathy, unspecified: Secondary | ICD-10-CM | POA: Diagnosis not present

## 2015-12-23 DIAGNOSIS — M25572 Pain in left ankle and joints of left foot: Secondary | ICD-10-CM | POA: Diagnosis not present

## 2015-12-26 DIAGNOSIS — M25572 Pain in left ankle and joints of left foot: Secondary | ICD-10-CM | POA: Diagnosis not present

## 2016-01-08 DIAGNOSIS — M25572 Pain in left ankle and joints of left foot: Secondary | ICD-10-CM | POA: Diagnosis not present

## 2016-01-21 DIAGNOSIS — M25572 Pain in left ankle and joints of left foot: Secondary | ICD-10-CM | POA: Diagnosis not present

## 2016-02-19 DIAGNOSIS — M2042 Other hammer toe(s) (acquired), left foot: Secondary | ICD-10-CM | POA: Diagnosis not present

## 2016-05-05 DIAGNOSIS — I10 Essential (primary) hypertension: Secondary | ICD-10-CM | POA: Diagnosis not present

## 2016-05-05 DIAGNOSIS — J449 Chronic obstructive pulmonary disease, unspecified: Secondary | ICD-10-CM | POA: Diagnosis not present

## 2016-05-05 DIAGNOSIS — G4700 Insomnia, unspecified: Secondary | ICD-10-CM | POA: Diagnosis not present

## 2016-07-05 DIAGNOSIS — J449 Chronic obstructive pulmonary disease, unspecified: Secondary | ICD-10-CM | POA: Diagnosis not present

## 2017-02-23 DIAGNOSIS — R062 Wheezing: Secondary | ICD-10-CM | POA: Diagnosis not present

## 2017-02-23 DIAGNOSIS — J101 Influenza due to other identified influenza virus with other respiratory manifestations: Secondary | ICD-10-CM | POA: Diagnosis not present

## 2017-02-23 DIAGNOSIS — R6889 Other general symptoms and signs: Secondary | ICD-10-CM | POA: Diagnosis not present

## 2017-03-08 DIAGNOSIS — L989 Disorder of the skin and subcutaneous tissue, unspecified: Secondary | ICD-10-CM | POA: Diagnosis not present

## 2017-03-08 DIAGNOSIS — B078 Other viral warts: Secondary | ICD-10-CM | POA: Diagnosis not present

## 2017-04-06 DIAGNOSIS — Z125 Encounter for screening for malignant neoplasm of prostate: Secondary | ICD-10-CM | POA: Diagnosis not present

## 2017-04-06 DIAGNOSIS — E785 Hyperlipidemia, unspecified: Secondary | ICD-10-CM | POA: Diagnosis not present

## 2017-04-06 DIAGNOSIS — I10 Essential (primary) hypertension: Secondary | ICD-10-CM | POA: Diagnosis not present

## 2017-04-06 DIAGNOSIS — Z0000 Encounter for general adult medical examination without abnormal findings: Secondary | ICD-10-CM | POA: Diagnosis not present

## 2017-04-25 DIAGNOSIS — K5900 Constipation, unspecified: Secondary | ICD-10-CM | POA: Diagnosis not present

## 2017-04-25 DIAGNOSIS — Z800 Family history of malignant neoplasm of digestive organs: Secondary | ICD-10-CM | POA: Diagnosis not present

## 2017-06-27 DIAGNOSIS — Z1211 Encounter for screening for malignant neoplasm of colon: Secondary | ICD-10-CM | POA: Diagnosis not present

## 2017-06-27 DIAGNOSIS — K648 Other hemorrhoids: Secondary | ICD-10-CM | POA: Diagnosis not present

## 2017-06-27 DIAGNOSIS — K5730 Diverticulosis of large intestine without perforation or abscess without bleeding: Secondary | ICD-10-CM | POA: Diagnosis not present

## 2017-06-27 DIAGNOSIS — K635 Polyp of colon: Secondary | ICD-10-CM | POA: Diagnosis not present

## 2017-06-27 DIAGNOSIS — Z800 Family history of malignant neoplasm of digestive organs: Secondary | ICD-10-CM | POA: Diagnosis not present

## 2017-06-27 DIAGNOSIS — D126 Benign neoplasm of colon, unspecified: Secondary | ICD-10-CM | POA: Diagnosis not present

## 2017-06-29 DIAGNOSIS — D126 Benign neoplasm of colon, unspecified: Secondary | ICD-10-CM | POA: Diagnosis not present

## 2017-06-29 DIAGNOSIS — K635 Polyp of colon: Secondary | ICD-10-CM | POA: Diagnosis not present

## 2017-06-29 DIAGNOSIS — Z1211 Encounter for screening for malignant neoplasm of colon: Secondary | ICD-10-CM | POA: Diagnosis not present

## 2017-10-13 DIAGNOSIS — N3943 Post-void dribbling: Secondary | ICD-10-CM | POA: Diagnosis not present

## 2017-10-13 DIAGNOSIS — E785 Hyperlipidemia, unspecified: Secondary | ICD-10-CM | POA: Diagnosis not present

## 2017-10-13 DIAGNOSIS — F17210 Nicotine dependence, cigarettes, uncomplicated: Secondary | ICD-10-CM | POA: Diagnosis not present

## 2017-10-13 DIAGNOSIS — I10 Essential (primary) hypertension: Secondary | ICD-10-CM | POA: Diagnosis not present

## 2017-11-10 DIAGNOSIS — Z131 Encounter for screening for diabetes mellitus: Secondary | ICD-10-CM | POA: Diagnosis not present

## 2017-11-10 DIAGNOSIS — I10 Essential (primary) hypertension: Secondary | ICD-10-CM | POA: Diagnosis not present

## 2017-11-10 DIAGNOSIS — E785 Hyperlipidemia, unspecified: Secondary | ICD-10-CM | POA: Diagnosis not present

## 2017-12-24 IMAGING — US US CAROTID DUPLEX BILAT
1 series · 13 of 24 positions shown · non-contrast
Comparison: None.

CLINICAL DATA: Left-sided carotid bruit. History of hypertension
and smoking.

EXAM:
BILATERAL CAROTID DUPLEX ULTRASOUND
TECHNIQUE: Gray scale imaging, color Doppler and duplex ultrasound were
performed of bilateral carotid and vertebral arteries in the neck.

[Series 1: us carotid duplex bilat · 0.04mm/px · 13 of 91 slices shown]
[im 1/91]
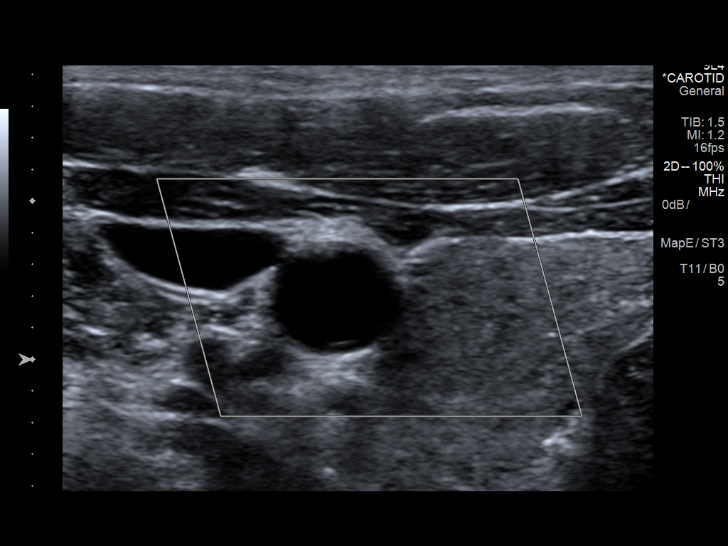
[im 8/91]
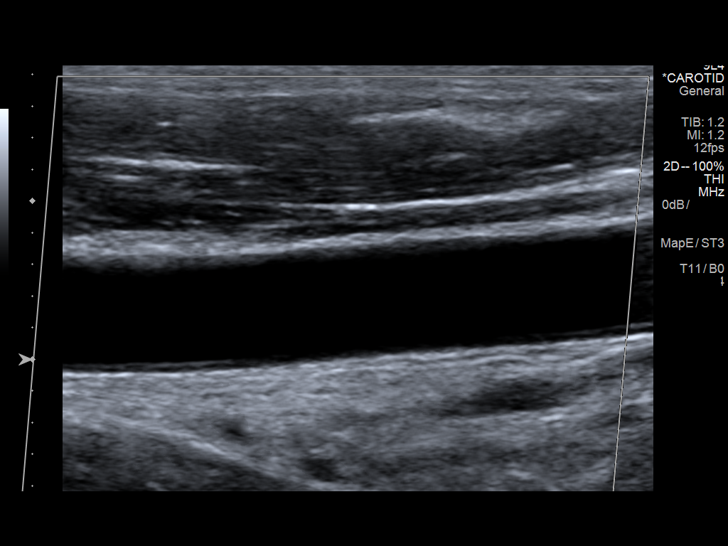
[im 16/91]
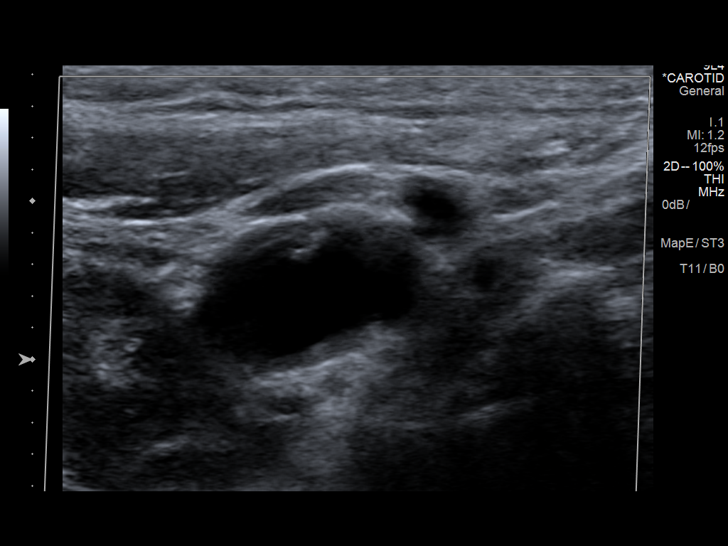
[im 24/91]
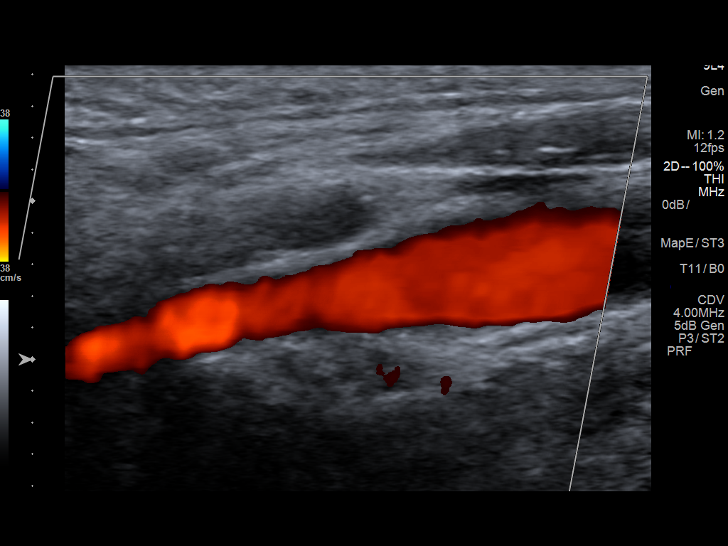
[im 32/91]
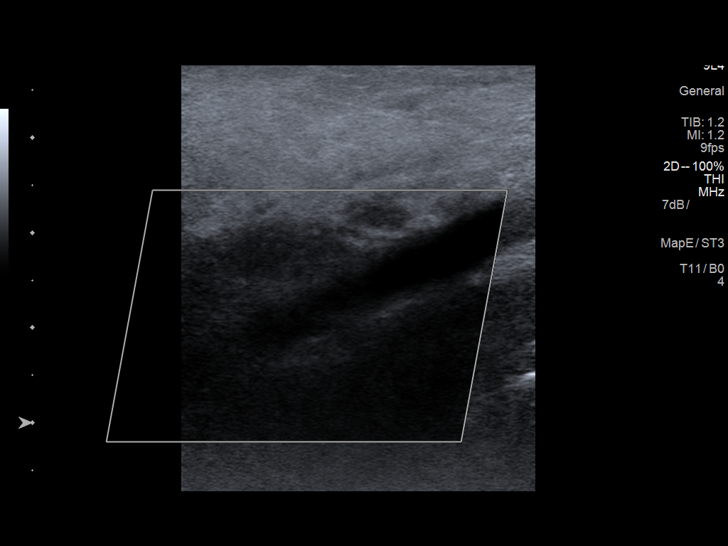
[im 40/91]
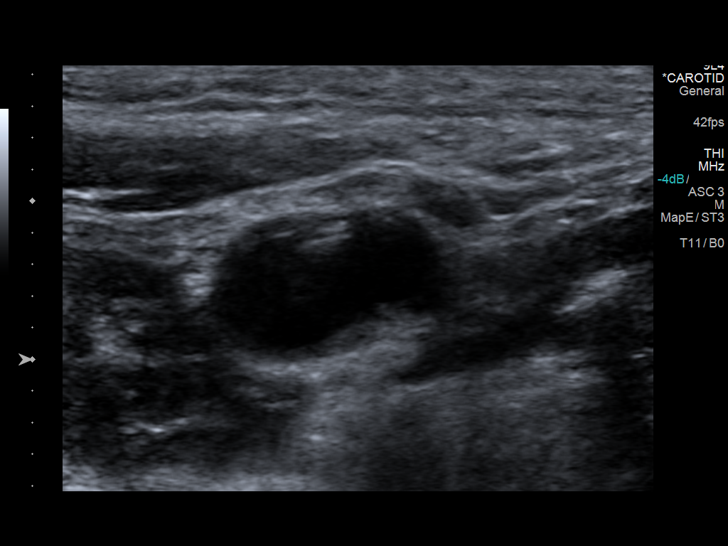
[im 47/91]
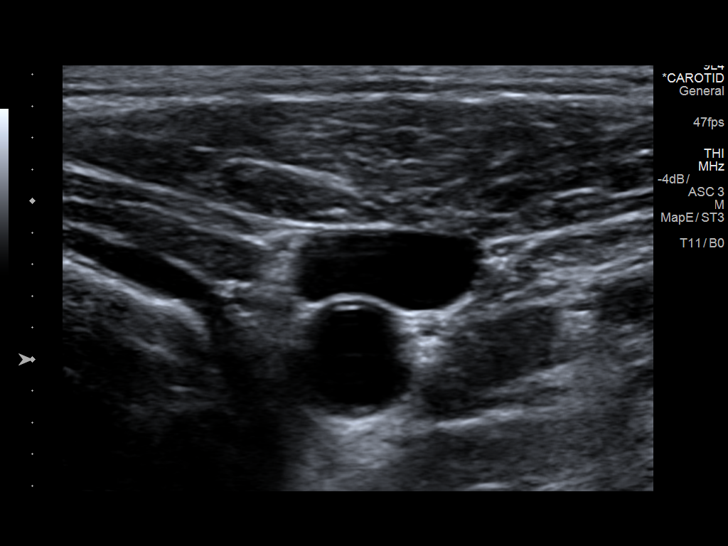
[im 51/91]
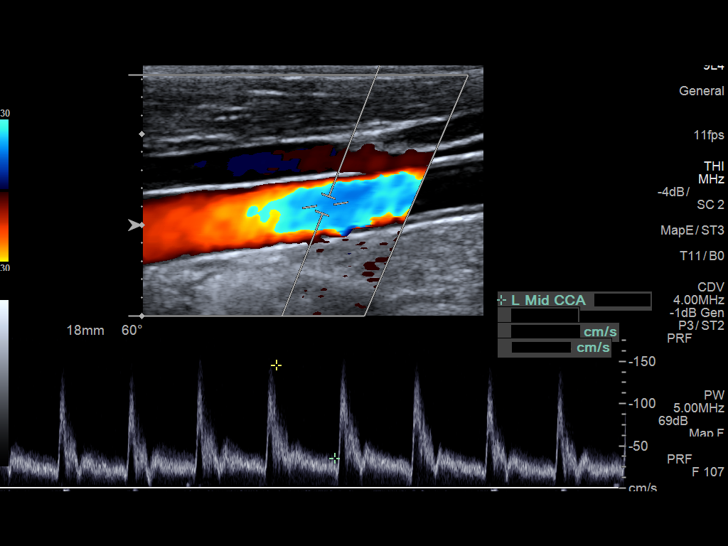
[im 59/91]
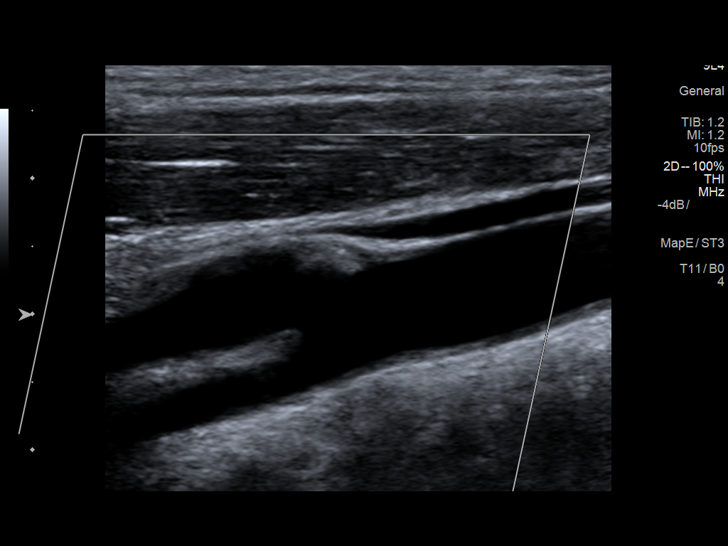
[im 67/91]
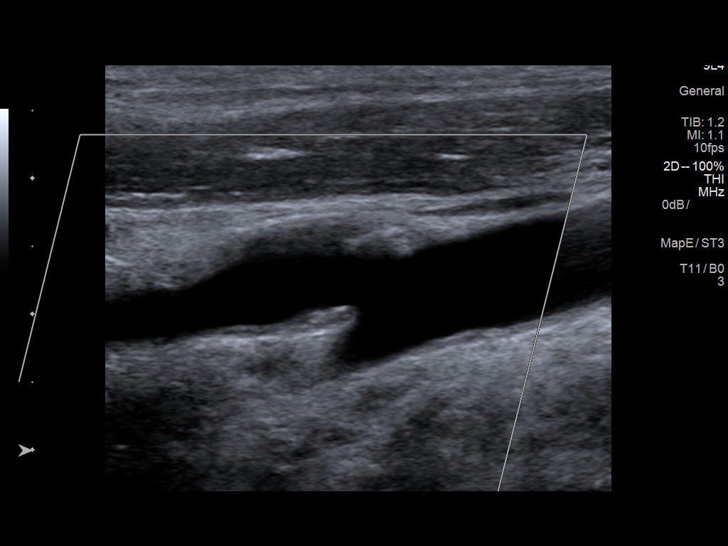
[im 75/91]
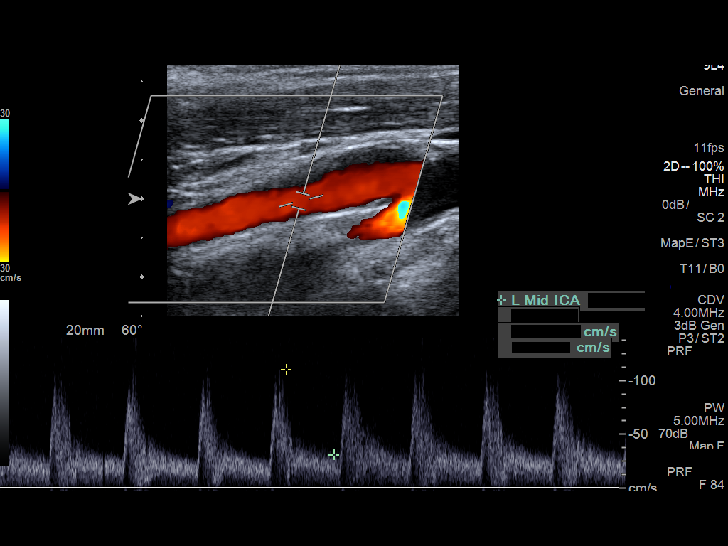
[im 83/91]
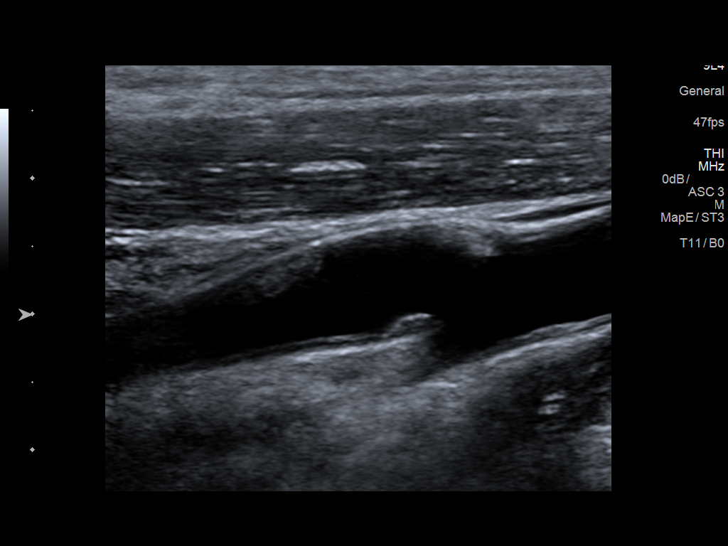
[im 91/91]
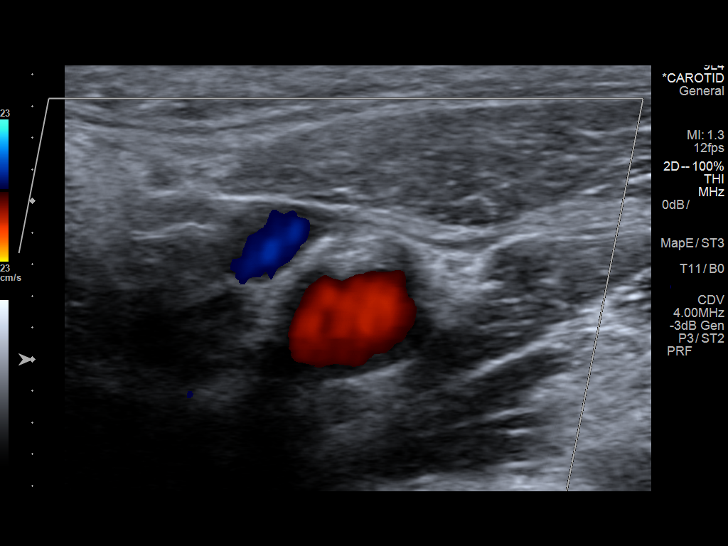

[13 of 24 positions shown; findings below may reference images not displayed]

FINDINGS: Criteria: Quantification of carotid stenosis is based on velocity
parameters that correlate the residual internal carotid diameter
with NASCET-based stenosis levels, using the diameter of the distal
internal carotid lumen as the denominator for stenosis measurement.

The following velocity measurements were obtained:

RIGHT

ICA:  104/45 cm/sec

CCA:  170/31 cm/sec

SYSTOLIC ICA/CCA RATIO:

DIASTOLIC ICA/CCA RATIO:

ECA:  171 cm/sec

LEFT

ICA:  110/31 cm/sec

CCA:  170/36 cm/sec

SYSTOLIC ICA/CCA RATIO:

DIASTOLIC ICA/CCA RATIO:

ECA:  143 cm/sec

RIGHT CAROTID ARTERY: There is a minimal amount of eccentric mixed
in a plaque within the right carotid bulb (images 17, 19, 41 and 42)
extending to involve the right internal carotid artery (image 22 and
27), not resulting in elevated peak systolic velocities within the
interrogated course of the right internal carotid artery to suggest
a hemodynamically significant stenosis.

RIGHT VERTEBRAL ARTERY:  Antegrade flow

LEFT CAROTID ARTERY: There is a moderate amount of eccentric mixed
echogenic plaque within the left carotid bulb (representative images
58 and 60), extending to involve the origin and proximal aspect the
left internal carotid artery (images 68 and 84), not resulting in
elevated peak systolic velocities within the interrogated course of
the left internal carotid artery to suggest a hemodynamically
significant stenosis. Borderline elevated peak systolic velocity
within distal most aspect the left internal carotid artery is felt
to be factitiously elevated due to sampling at a location of
tortuous flow.

LEFT VERTEBRAL ARTERY:  Antegrade flow
IMPRESSION: Minimal to moderate amount of bilateral atherosclerotic plaque, left
subjectively greater than right, not resulting in a hemodynamically
significant stenosis within either internal carotid artery.

## 2018-04-24 DIAGNOSIS — I10 Essential (primary) hypertension: Secondary | ICD-10-CM | POA: Diagnosis not present

## 2018-04-24 DIAGNOSIS — F17200 Nicotine dependence, unspecified, uncomplicated: Secondary | ICD-10-CM | POA: Diagnosis not present

## 2018-04-24 DIAGNOSIS — E785 Hyperlipidemia, unspecified: Secondary | ICD-10-CM | POA: Diagnosis not present

## 2018-04-24 DIAGNOSIS — J449 Chronic obstructive pulmonary disease, unspecified: Secondary | ICD-10-CM | POA: Diagnosis not present

## 2018-11-17 DIAGNOSIS — Z20828 Contact with and (suspected) exposure to other viral communicable diseases: Secondary | ICD-10-CM | POA: Diagnosis not present

## 2019-05-28 DIAGNOSIS — I10 Essential (primary) hypertension: Secondary | ICD-10-CM | POA: Diagnosis not present

## 2019-05-28 DIAGNOSIS — J449 Chronic obstructive pulmonary disease, unspecified: Secondary | ICD-10-CM | POA: Diagnosis not present

## 2019-11-19 DIAGNOSIS — E785 Hyperlipidemia, unspecified: Secondary | ICD-10-CM | POA: Diagnosis not present

## 2019-11-19 DIAGNOSIS — Z131 Encounter for screening for diabetes mellitus: Secondary | ICD-10-CM | POA: Diagnosis not present

## 2019-11-19 DIAGNOSIS — Z0000 Encounter for general adult medical examination without abnormal findings: Secondary | ICD-10-CM | POA: Diagnosis not present

## 2019-11-19 DIAGNOSIS — Z125 Encounter for screening for malignant neoplasm of prostate: Secondary | ICD-10-CM | POA: Diagnosis not present

## 2020-08-14 DIAGNOSIS — E785 Hyperlipidemia, unspecified: Secondary | ICD-10-CM | POA: Diagnosis not present

## 2020-08-14 DIAGNOSIS — I10 Essential (primary) hypertension: Secondary | ICD-10-CM | POA: Diagnosis not present

## 2020-08-14 DIAGNOSIS — J449 Chronic obstructive pulmonary disease, unspecified: Secondary | ICD-10-CM | POA: Diagnosis not present

## 2020-11-21 DIAGNOSIS — Z125 Encounter for screening for malignant neoplasm of prostate: Secondary | ICD-10-CM | POA: Diagnosis not present

## 2020-11-21 DIAGNOSIS — Z0000 Encounter for general adult medical examination without abnormal findings: Secondary | ICD-10-CM | POA: Diagnosis not present

## 2020-11-21 DIAGNOSIS — Z131 Encounter for screening for diabetes mellitus: Secondary | ICD-10-CM | POA: Diagnosis not present

## 2020-11-21 DIAGNOSIS — Z13220 Encounter for screening for lipoid disorders: Secondary | ICD-10-CM | POA: Diagnosis not present

## 2020-12-31 DIAGNOSIS — L570 Actinic keratosis: Secondary | ICD-10-CM | POA: Diagnosis not present

## 2020-12-31 DIAGNOSIS — R208 Other disturbances of skin sensation: Secondary | ICD-10-CM | POA: Diagnosis not present

## 2020-12-31 DIAGNOSIS — Z789 Other specified health status: Secondary | ICD-10-CM | POA: Diagnosis not present

## 2020-12-31 DIAGNOSIS — L820 Inflamed seborrheic keratosis: Secondary | ICD-10-CM | POA: Diagnosis not present

## 2020-12-31 DIAGNOSIS — L298 Other pruritus: Secondary | ICD-10-CM | POA: Diagnosis not present

## 2021-02-13 DIAGNOSIS — I10 Essential (primary) hypertension: Secondary | ICD-10-CM | POA: Diagnosis not present

## 2021-02-13 DIAGNOSIS — J449 Chronic obstructive pulmonary disease, unspecified: Secondary | ICD-10-CM | POA: Diagnosis not present

## 2021-09-01 DIAGNOSIS — I10 Essential (primary) hypertension: Secondary | ICD-10-CM | POA: Diagnosis not present

## 2021-09-01 DIAGNOSIS — E785 Hyperlipidemia, unspecified: Secondary | ICD-10-CM | POA: Diagnosis not present

## 2021-09-01 DIAGNOSIS — R1011 Right upper quadrant pain: Secondary | ICD-10-CM | POA: Diagnosis not present

## 2021-09-03 ENCOUNTER — Other Ambulatory Visit (HOSPITAL_BASED_OUTPATIENT_CLINIC_OR_DEPARTMENT_OTHER): Payer: Self-pay | Admitting: Family Medicine

## 2021-09-03 DIAGNOSIS — Z122 Encounter for screening for malignant neoplasm of respiratory organs: Secondary | ICD-10-CM

## 2021-09-18 ENCOUNTER — Ambulatory Visit (HOSPITAL_BASED_OUTPATIENT_CLINIC_OR_DEPARTMENT_OTHER)
Admission: RE | Admit: 2021-09-18 | Discharge: 2021-09-18 | Disposition: A | Discharge status: Home / Self Care | Payer: BC Managed Care – PPO | Source: Ambulatory Visit | Attending: Family Medicine | Admitting: Family Medicine

## 2021-09-18 DIAGNOSIS — Z122 Encounter for screening for malignant neoplasm of respiratory organs: Secondary | ICD-10-CM | POA: Insufficient documentation

## 2021-09-18 DIAGNOSIS — F17210 Nicotine dependence, cigarettes, uncomplicated: Secondary | ICD-10-CM | POA: Diagnosis not present

## 2021-11-24 DIAGNOSIS — E785 Hyperlipidemia, unspecified: Secondary | ICD-10-CM | POA: Diagnosis not present

## 2021-11-24 DIAGNOSIS — J449 Chronic obstructive pulmonary disease, unspecified: Secondary | ICD-10-CM | POA: Diagnosis not present

## 2021-11-24 DIAGNOSIS — Z0000 Encounter for general adult medical examination without abnormal findings: Secondary | ICD-10-CM | POA: Diagnosis not present

## 2021-11-24 DIAGNOSIS — I700 Atherosclerosis of aorta: Secondary | ICD-10-CM | POA: Diagnosis not present

## 2021-11-24 DIAGNOSIS — I10 Essential (primary) hypertension: Secondary | ICD-10-CM | POA: Diagnosis not present

## 2021-11-24 DIAGNOSIS — Z125 Encounter for screening for malignant neoplasm of prostate: Secondary | ICD-10-CM | POA: Diagnosis not present

## 2022-05-27 DIAGNOSIS — I10 Essential (primary) hypertension: Secondary | ICD-10-CM | POA: Diagnosis not present

## 2022-05-27 DIAGNOSIS — I700 Atherosclerosis of aorta: Secondary | ICD-10-CM | POA: Diagnosis not present

## 2022-05-27 DIAGNOSIS — E785 Hyperlipidemia, unspecified: Secondary | ICD-10-CM | POA: Diagnosis not present

## 2022-05-27 DIAGNOSIS — J449 Chronic obstructive pulmonary disease, unspecified: Secondary | ICD-10-CM | POA: Diagnosis not present

## 2023-01-03 ENCOUNTER — Encounter: Payer: Self-pay | Admitting: Internal Medicine
